# Patient Record
Sex: Male | Born: 1960 | Hispanic: Yes | Marital: Married | Smoking: Never smoker
Health system: Southern US, Community
[De-identification: ages and names within clinical notes are randomized; demographics above are authoritative.]

## PROBLEM LIST (undated history)

## (undated) HISTORY — PX: HERNIA REPAIR: SHX51

---

## 2006-10-05 ENCOUNTER — Ambulatory Visit: Payer: Self-pay | Admitting: Family Medicine

## 2007-08-24 ENCOUNTER — Ambulatory Visit: Payer: Self-pay | Admitting: Family Medicine

## 2008-05-12 IMAGING — CR DG RIBS 2V*L*
1 series · 3 of 3 positions shown · non-contrast
Comparison: none

REASON FOR EXAM: xray lt rib injury call report office [phone] fx
[phone]
COMMENTS:

RESULT:     Three views of the LEFT ribs show a minimally displaced fracture
of the 10th LEFT rib posterolaterally. No other rib fractures are seen. No
pneumothorax is observed.

[Series 1: view not recorded · 0.17mm/px · 3 of 3 slices shown]
[im 1/3]
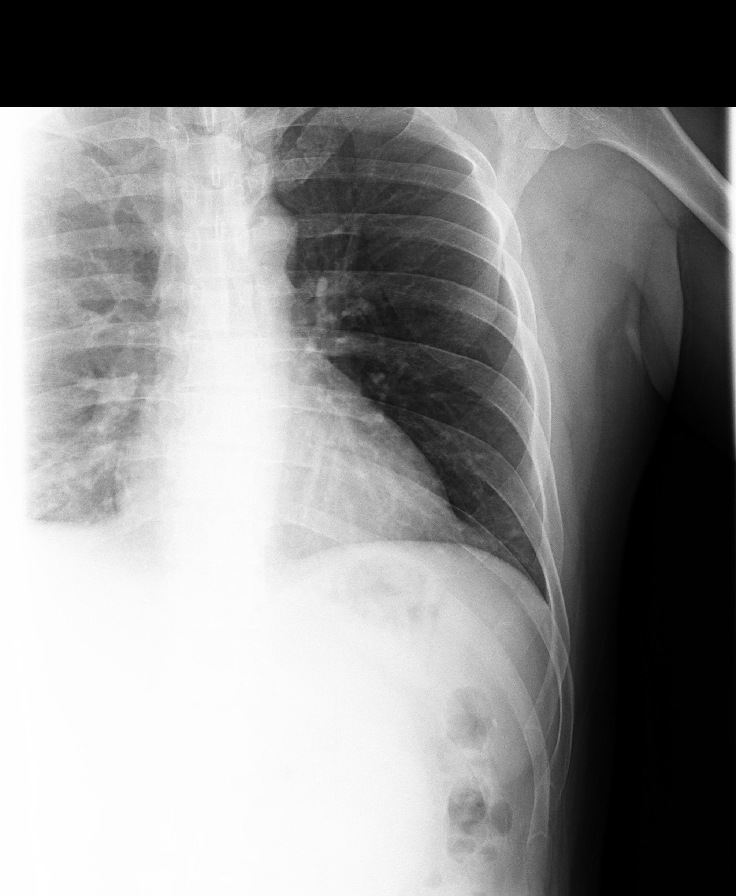
[im 2/3]
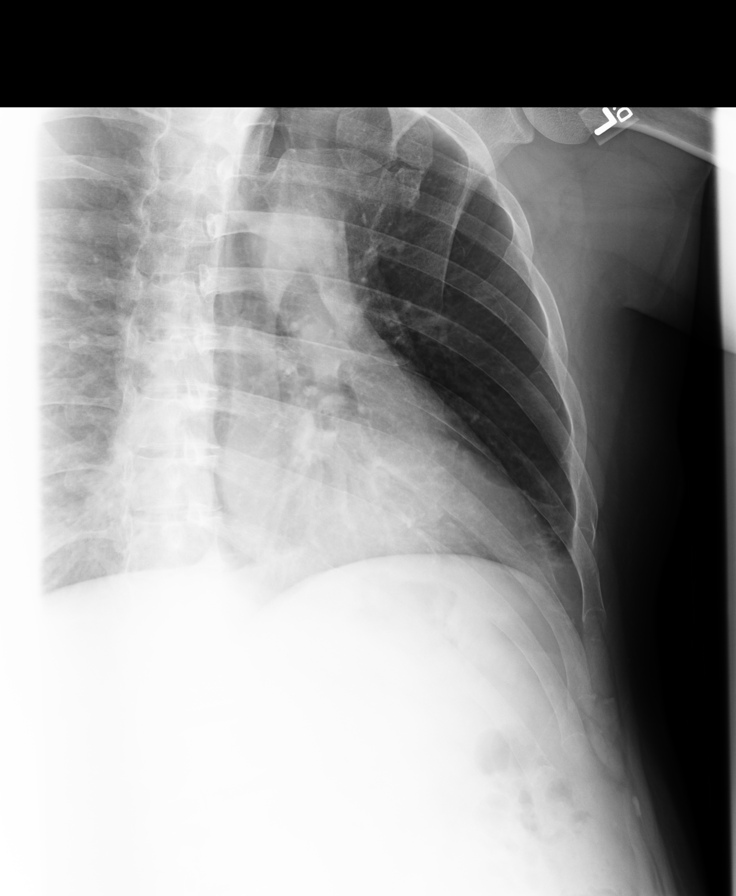
[im 3/3]
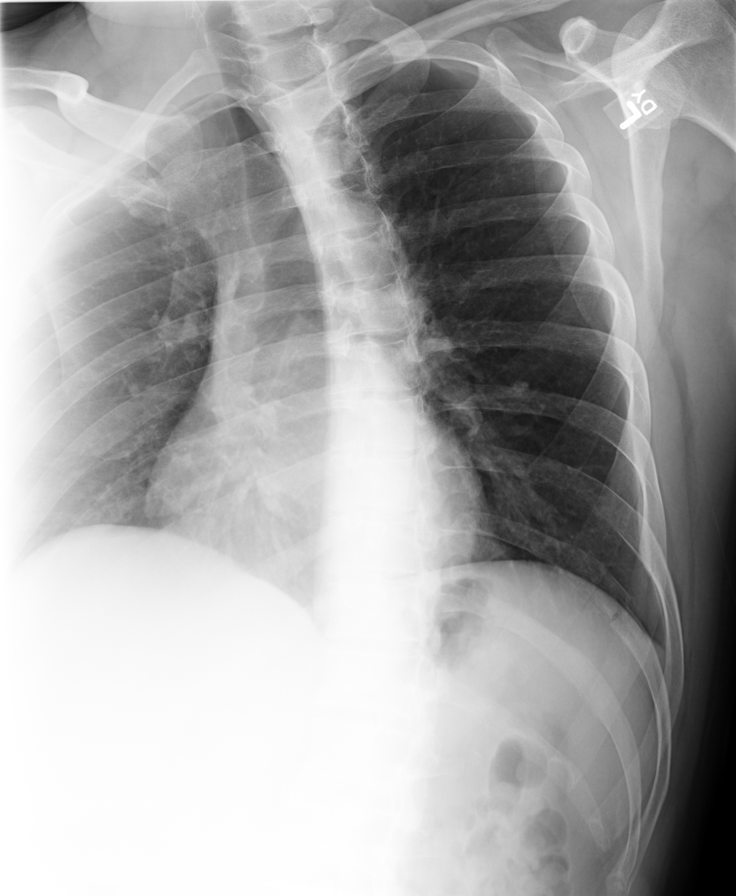

[3 of 3 positions shown; findings below may reference images not displayed]

IMPRESSION: 1.     Fracture of the 10th LEFT rib, minimally displaced.

## 2009-03-31 IMAGING — CR DG CHEST 2V
1 series · 2 of 2 positions shown · non-contrast
Comparison: none

REASON FOR EXAM: pain after lifting heavy lumber
COMMENTS:

PROCEDURE:     MDR - MDR CHEST PA(OR AP) AND LATERAL  - August 24, 2007 [DATE]
RESULT:     The lung fields are clear. The heart, mediastinal and osseous
structures show no significant abnormalities.

[Series 1: view not recorded · 0.17mm/px · 2 of 2 slices shown]
[im 1/2]
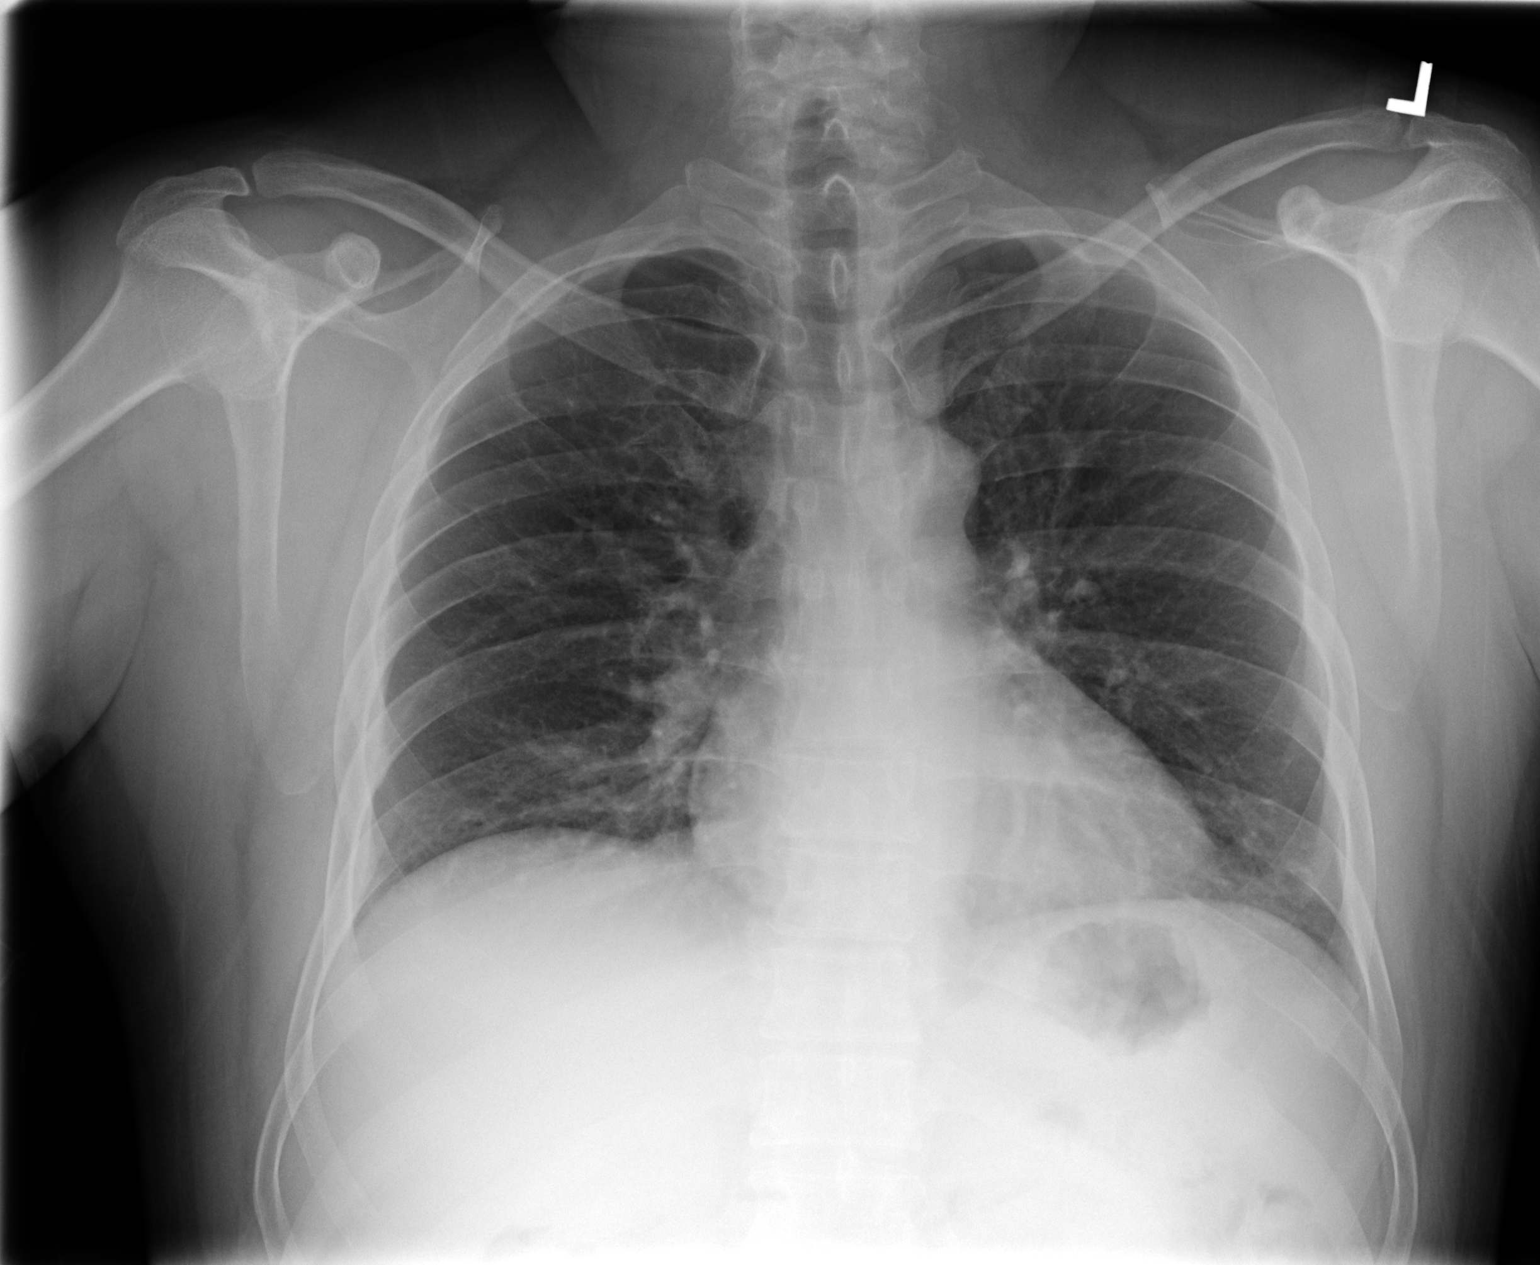
[im 2/2]
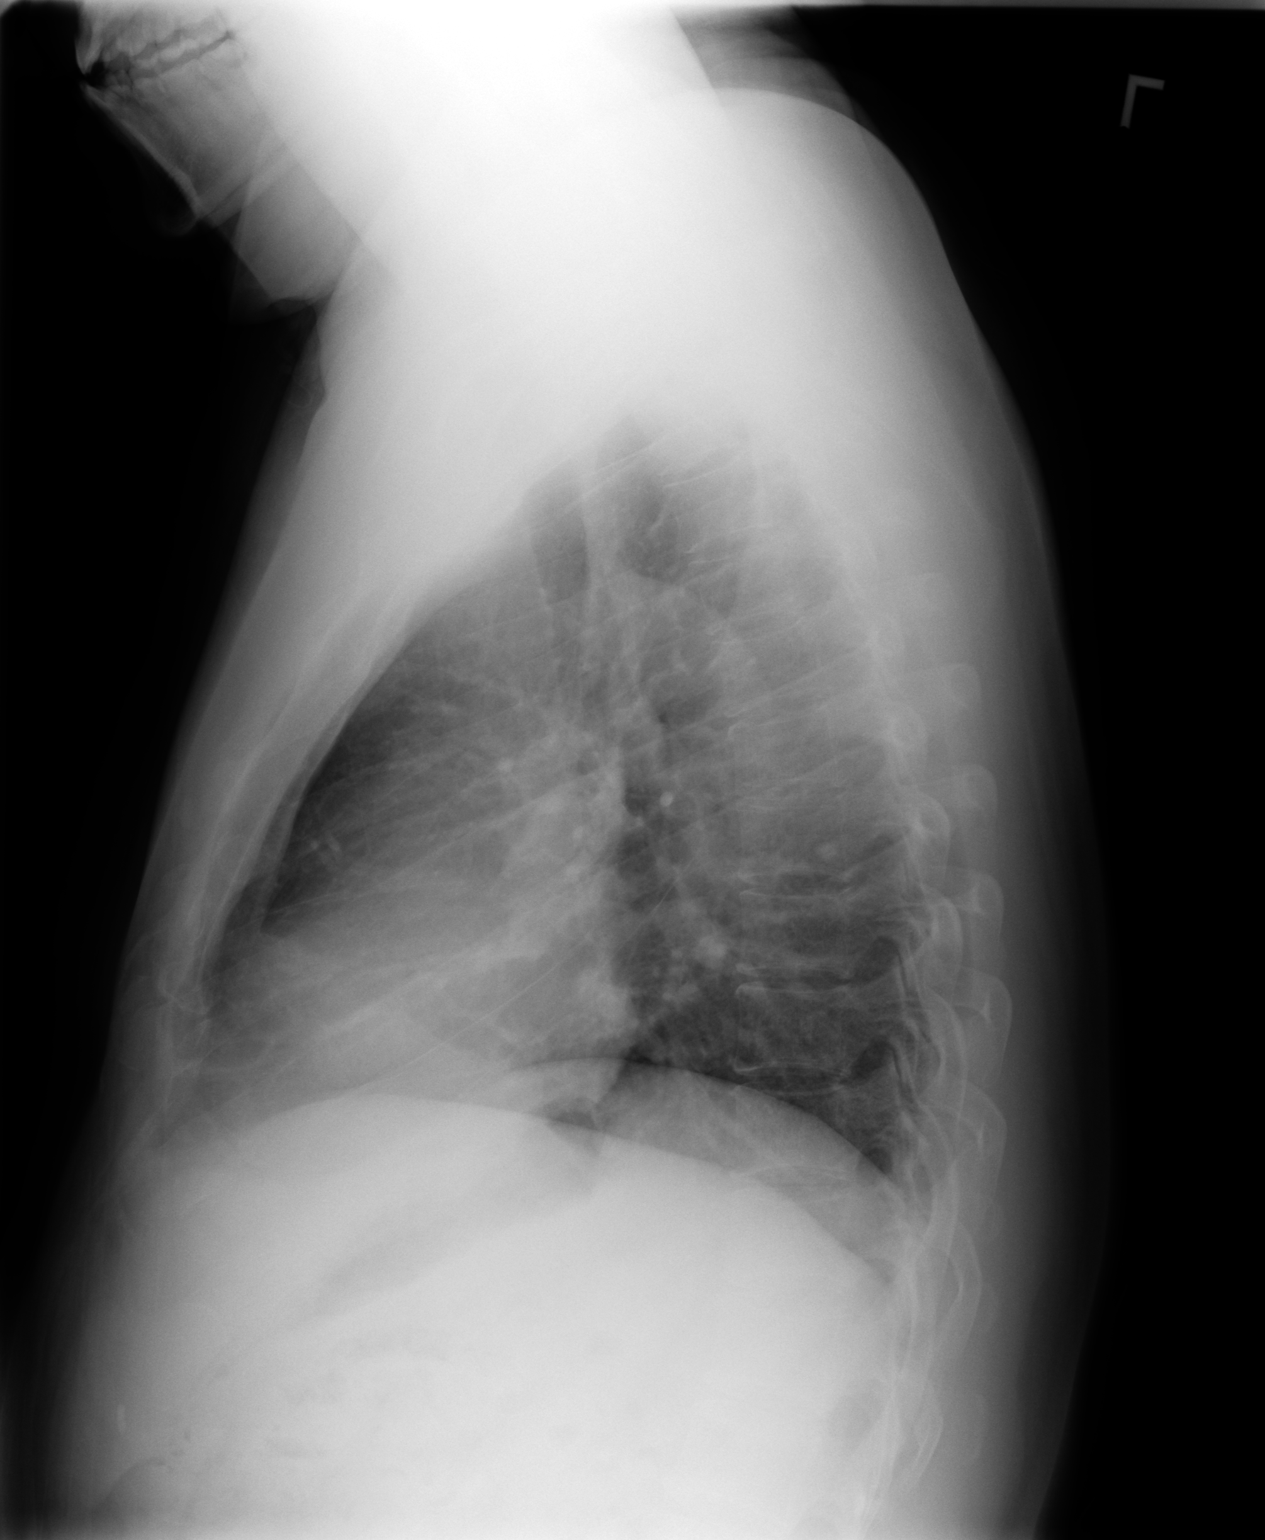

[2 of 2 positions shown; findings below may reference images not displayed]

IMPRESSION: 1.     No acute changes are identified.

## 2019-10-05 ENCOUNTER — Emergency Department
Admission: EM | Admit: 2019-10-05 | Discharge: 2019-10-05 | Disposition: A | Discharge status: Home / Self Care | Payer: BC Managed Care – PPO | Attending: Emergency Medicine | Admitting: Emergency Medicine

## 2019-10-05 ENCOUNTER — Encounter: Payer: Self-pay | Admitting: Emergency Medicine

## 2019-10-05 ENCOUNTER — Other Ambulatory Visit: Payer: Self-pay

## 2019-10-05 ENCOUNTER — Emergency Department: Payer: BC Managed Care – PPO

## 2019-10-05 DIAGNOSIS — R0602 Shortness of breath: Secondary | ICD-10-CM | POA: Diagnosis present

## 2019-10-05 DIAGNOSIS — R109 Unspecified abdominal pain: Secondary | ICD-10-CM | POA: Insufficient documentation

## 2019-10-05 LAB — CBC WITH DIFFERENTIAL/PLATELET
Abs Immature Granulocytes: 0.05 10*3/uL (ref 0.00–0.07)
Basophils Absolute: 0 10*3/uL (ref 0.0–0.1)
Basophils Relative: 0 %
Eosinophils Absolute: 0.3 10*3/uL (ref 0.0–0.5)
Eosinophils Relative: 6 %
HCT: 41.5 % (ref 39.0–52.0)
Hemoglobin: 14.9 g/dL (ref 13.0–17.0)
Immature Granulocytes: 1 %
Lymphocytes Relative: 29 %
Lymphs Abs: 1.4 10*3/uL (ref 0.7–4.0)
MCH: 31 pg (ref 26.0–34.0)
MCHC: 35.9 g/dL (ref 30.0–36.0)
MCV: 86.5 fL (ref 80.0–100.0)
Monocytes Absolute: 0.5 10*3/uL (ref 0.1–1.0)
Monocytes Relative: 10 %
Neutro Abs: 2.7 10*3/uL (ref 1.7–7.7)
Neutrophils Relative %: 54 %
Platelets: 149 10*3/uL — ABNORMAL LOW (ref 150–400)
RBC: 4.8 MIL/uL (ref 4.22–5.81)
RDW: 12.9 % (ref 11.5–15.5)
WBC: 4.9 10*3/uL (ref 4.0–10.5)
nRBC: 0 % (ref 0.0–0.2)

## 2019-10-05 LAB — COMPREHENSIVE METABOLIC PANEL
ALT: 24 U/L (ref 0–44)
AST: 22 U/L (ref 15–41)
Albumin: 3.9 g/dL (ref 3.5–5.0)
Alkaline Phosphatase: 57 U/L (ref 38–126)
Anion gap: 8 (ref 5–15)
BUN: 14 mg/dL (ref 6–20)
CO2: 28 mmol/L (ref 22–32)
Calcium: 8.4 mg/dL — ABNORMAL LOW (ref 8.9–10.3)
Chloride: 104 mmol/L (ref 98–111)
Creatinine, Ser: 0.61 mg/dL (ref 0.61–1.24)
GFR calc Af Amer: >60 mL/min (ref >60)
GFR calc non Af Amer: >60 mL/min (ref >60)
Glucose, Bld: 110 mg/dL — ABNORMAL HIGH (ref 70–99)
Potassium: 3.3 mmol/L — ABNORMAL LOW (ref 3.5–5.1)
Sodium: 140 mmol/L (ref 135–145)
Total Bilirubin: 1 mg/dL (ref 0.3–1.2)
Total Protein: 6.8 g/dL (ref 6.5–8.1)

## 2019-10-05 LAB — TROPONIN I (HIGH SENSITIVITY)
Troponin I (High Sensitivity): 7 ng/L (ref <18)
Troponin I (High Sensitivity): 7 ng/L (ref <18)

## 2019-10-05 LAB — LIPASE, BLOOD: Lipase: 32 U/L (ref 11–51)

## 2019-10-05 MED ORDER — FAMOTIDINE 20 MG PO TABS
20.0000 mg | ORAL_TABLET | Freq: Two times a day (BID) | ORAL | 0 refills | Status: AC
Start: 2019-10-05 — End: 2019-11-04

## 2019-10-05 MED ORDER — ALUM & MAG HYDROXIDE-SIMETH 200-200-20 MG/5ML PO SUSP
30.0000 mL | Freq: Once | ORAL | Status: AC
  Administered 2019-10-05: 15:00:00 30 mL via ORAL
  Filled 2019-10-05: qty 30

## 2019-10-05 NOTE — ED Triage Notes (Signed)
C/O SOB, stomach is swelling.  Patient thought symptoms were r/t acid reflux. Took Alka Seltzer, no relief.  Also c/o lower back pain.  All symptoms started this morning.  Patient is AAOx3.  Skin warm and dry. No SOB? DOE.  NAD

## 2019-10-05 NOTE — ED Provider Notes (Signed)
Troponin unchanged from previous check.  Patient ambulatory no distress.  Appropriate for discharge, following plan as outlined by Dr. Colon Branch and Dr. Sinclair Ship, MD 10/05/19 1718

## 2019-10-05 NOTE — Discharge Instructions (Signed)
Thank you for letting us take care of you in the emergency department today.   Please continue to take any regular, prescribed medications.   New medications we have prescribed:  Famotidine, for stomach inflammation  Please follow up with: Your primary care doctor to review your ER visit and follow up on your symptoms. If you do not have one, information for the [facility] is below.  Please return to the ER for any new or worsening symptoms.

## 2019-10-05 NOTE — ED Provider Notes (Signed)
[facility] Emergency Department Provider Note  ____________________________________________   First MD Initiated Contact with Patient 10/05/19 1257     (approximate)  I have reviewed the triage vital signs and the nursing notes.  History  Chief Complaint Shortness of Breath    HPI John Osborne is a 59 y.o. male no significant medical history who presents to the emergency department for shortness of breath and upper abdominal discomfort.  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  He states the symptoms made him feel short of breath.  He denies any discrete pain.  He went home from shift and took a nap, but when he woke up he felt like his symptoms were still there and therefore presented to the ER for further evaluation.  He denies any discrete abdominal pain or chest pain.  States symptoms feel more like inflammation of the upper abdominal area, mild in severity, no radiation.  No apparent alleviating or aggravating components.  Denies any heavy alcohol use.  No changes with eating.  No fevers or cough or sick contacts.   Past Medical Hx History reviewed. No pertinent past medical history.  Problem List There are no problems to display for this patient.   Past Surgical Hx Past Surgical History:  Procedure Laterality Date  . HERNIA REPAIR      Medications Prior to Admission medications   Not on File    Allergies Patient has no known allergies.  Family Hx No family history on file.  Social Hx Social History   Tobacco Use  . Smoking status: Never Smoker  . Smokeless tobacco: Never Used  Substance Use Topics  . Alcohol use: Not Currently  . Drug use: Not Currently     Review of Systems  Constitutional: Negative for fever.  Negative for chills. Eyes: Negative for visual changes. ENT: Negative for sore throat. Cardiovascular: Negative for chest pain. Respiratory: + for shortness of breath. Gastrointestinal: Negative for nausea. Negative for vomiting. + "inflammation" Genitourinary: Negative for dysuria. Musculoskeletal: Negative for leg swelling. Skin: Negative for rash. Neurological: Negative for headaches.   Physical Exam  Vital Signs: ED Triage Vitals  Enc Vitals Group     BP 10/05/19 1101 (!) 171/91     Pulse Rate 10/05/19 1101 69     Resp 10/05/19 1101 16     Temp 10/05/19 1101 98 F (36.7 C)     Temp Source 10/05/19 1101 Oral     SpO2 10/05/19 1101 97 %     Weight 10/05/19 1058 155 lb (70.3 kg)     Height 10/05/19 1058 5\' 3"  (1.6 m)     Head Circumference --      Peak Flow --      Pain Score 10/05/19 1057 0     Pain Loc --      Pain Edu? --      Excl. in [place]? --     Constitutional: Alert and oriented. Well appearing. NAD.  Head: Normocephalic. Atraumatic. Eyes: Conjunctivae clear. Sclera anicteric. Pupils equal and symmetric. Nose: No masses or lesions. No congestion or rhinorrhea. Mouth/Throat: Wearing mask.  Neck: No stridor. Trachea midline.  Cardiovascular: Normal rate, regular rhythm. Extremities well perfused. Respiratory: Normal respiratory effort.  Lungs CTAB. Gastrointestinal: Soft.  No distention. Non-tender throughout to deep  palpation.  Bowel sounds present. Genitourinary: Deferred. Musculoskeletal: No lower extremity edema. No deformities. Neurologic:  Normal speech and language. No gross focal or lateralizing neurologic deficits are appreciated.  Skin: Skin is warm, dry and intact. No rash noted. Psychiatric: Mood and affect are appropriate for situation.  EKG  Personally reviewed and interpreted by myself.   Date: 10/05/19 Time: 1100 Rate: 70 Rhythm: sinus, w/ sinus arrhythmia  Axis: normal Intervals: WNL Nonspecific changes aVL, no reciprocal changes No  STEMI    Radiology  Personally reviewed available imaging myself.   CXR - IMPRESSION: No evidence of acute cardiopulmonary abnormality.   Procedures  Procedure(s) performed (including critical care):  Procedures   Initial Impression / Assessment and Plan / MDM / ED Course  59 y.o. male who presents to the ED for feeling "inflammation" in his upper abdominal area with resultant feeling of SOB.  Ddx: atypical ACS, gastritis/esophagitis, GERD, pancreatitis, lower lobe pneumonia.  No tachycardia, tachypnea, hypoxia, leg swelling, doubt PE.  Will plan for labs, EKG, imaging  EKG without evidence of acute arrhythmia or ischemia.  Initial troponin negative.  Labs thus far without actionable derangements - normal bilirubin, normal LFTs, normal lipase.  CXR negative.  Will treat with GI cocktail, and obtain delta troponin.  If negative, anticipate discharge with famotidine and outpatient follow-up.   _______________________________   As part of my medical decision making I have reviewed available labs, radiology tests, reviewed old records/performed chart review.   Final Clinical Impression(s) / ED Diagnosis  Final diagnoses:  SOB (shortness of breath)  Abdominal discomfort       Note:  This document was prepared using Dragon voice recognition software and may include unintentional dictation errors.   Miguel Aschoff., MD 10/05/19 [phone]

## 2020-10-24 ENCOUNTER — Other Ambulatory Visit: Payer: Self-pay

## 2020-10-24 ENCOUNTER — Encounter: Payer: Self-pay | Admitting: Emergency Medicine

## 2020-10-24 ENCOUNTER — Emergency Department
Admission: EM | Admit: 2020-10-24 | Discharge: 2020-10-24 | Disposition: A | Discharge status: Home / Self Care | Payer: BC Managed Care – PPO | Attending: Emergency Medicine | Admitting: Emergency Medicine

## 2020-10-24 DIAGNOSIS — M791 Myalgia, unspecified site: Secondary | ICD-10-CM | POA: Insufficient documentation

## 2020-10-24 DIAGNOSIS — E860 Dehydration: Secondary | ICD-10-CM

## 2020-10-24 LAB — CBC WITH DIFFERENTIAL/PLATELET
Abs Immature Granulocytes: 0.08 10*3/uL — ABNORMAL HIGH (ref 0.00–0.07)
Basophils Absolute: 0 10*3/uL (ref 0.0–0.1)
Basophils Relative: 0 %
Eosinophils Absolute: 0 10*3/uL (ref 0.0–0.5)
Eosinophils Relative: 1 %
HCT: 48.7 % (ref 39.0–52.0)
Hemoglobin: 17.3 g/dL — ABNORMAL HIGH (ref 13.0–17.0)
Immature Granulocytes: 1 %
Lymphocytes Relative: 15 %
Lymphs Abs: 1.2 10*3/uL (ref 0.7–4.0)
MCH: 30.9 pg (ref 26.0–34.0)
MCHC: 35.5 g/dL (ref 30.0–36.0)
MCV: 87 fL (ref 80.0–100.0)
Monocytes Absolute: 0.8 10*3/uL (ref 0.1–1.0)
Monocytes Relative: 10 %
Neutro Abs: 5.9 10*3/uL (ref 1.7–7.7)
Neutrophils Relative %: 73 %
Platelets: 214 10*3/uL (ref 150–400)
RBC: 5.6 MIL/uL (ref 4.22–5.81)
RDW: 13.4 % (ref 11.5–15.5)
WBC: 8 10*3/uL (ref 4.0–10.5)
nRBC: 0 % (ref 0.0–0.2)

## 2020-10-24 LAB — BASIC METABOLIC PANEL
Anion gap: 16 — ABNORMAL HIGH (ref 5–15)
BUN: 27 mg/dL — ABNORMAL HIGH (ref 6–20)
CO2: 22 mmol/L (ref 22–32)
Calcium: 9.5 mg/dL (ref 8.9–10.3)
Chloride: 103 mmol/L (ref 98–111)
Creatinine, Ser: 1.88 mg/dL — ABNORMAL HIGH (ref 0.61–1.24)
GFR, Estimated: 40 mL/min — ABNORMAL LOW (ref >60)
Glucose, Bld: 117 mg/dL — ABNORMAL HIGH (ref 70–99)
Potassium: 4.3 mmol/L (ref 3.5–5.1)
Sodium: 141 mmol/L (ref 135–145)

## 2020-10-24 MED ORDER — SODIUM CHLORIDE 0.9 % IV BOLUS
1000.0000 mL | Freq: Once | INTRAVENOUS | Status: AC
  Administered 2020-10-24: 1000 mL via INTRAVENOUS

## 2020-10-24 NOTE — ED Notes (Addendum)
Interpreter used for discharge instructions provided to patient. IVF complete prior to discharge. Patient and wife both verbalized understanding of discharge instructions via interpreter. Patient ambulatory to POV with wife. Gait steady.

## 2020-10-24 NOTE — ED Provider Notes (Signed)
[facility] Emergency Department Provider Note ____________________________________________   Event Date/Time   First MD Initiated Contact with Patient 10/24/20 2009     (approximate)  I have reviewed the triage vital signs and the nursing notes.   HISTORY  Chief Complaint Generalized Body Aches  HPI John Osborne is a 60 y.o. male with no chronic medical history presents to the emergency department for treatment and evaluation of body cramping that started earlier today. He works outside in the heat all day. He drinks water but isn't sure it's enough. He took 3 green capsules, but isn't sure what they were. He denies nausea, vomiting, abdominal pain, or other concerns.     History reviewed. No pertinent past medical history.  There are no problems to display for this patient.   Past Surgical History:  Procedure Laterality Date  . HERNIA REPAIR      Prior to Admission medications   Medication Sig Start Date End Date Taking? Authorizing Provider  famotidine (PEPCID) 20 MG tablet Take 1 tablet (20 mg total) by mouth 2 (two) times daily. 10/05/19 11/04/19  Miguel Aschoff., MD    Allergies Patient has no known allergies.  History reviewed. No pertinent family history.  Social History Social History   Tobacco Use  . Smoking status: Never Smoker  . Smokeless tobacco: Never Used  Substance Use Topics  . Alcohol use: Not Currently  . Drug use: Not Currently    Review of Systems  Constitutional: No fever/chills Eyes: No visual changes. ENT: No sore throat. Cardiovascular: Denies chest pain. Respiratory: Denies shortness of breath. Gastrointestinal: No abdominal pain.  No nausea, no vomiting.  No diarrhea.  No constipation. Genitourinary: Negative for dysuria. Musculoskeletal: Positive for muscle cramping. Skin: Negative for rash. Neurological: Negative for headaches, focal weakness or  numbness. ____________________________________________   PHYSICAL EXAM:  VITAL SIGNS: ED Triage Vitals  Enc Vitals Group     BP 10/24/20 1811 (!) 168/114     Pulse Rate 10/24/20 1811 (!) 102     Resp 10/24/20 1811 18     Temp 10/24/20 1811 98.1 F (36.7 C)     Temp Source 10/24/20 1811 Oral     SpO2 10/24/20 1811 96 %     Weight 10/24/20 1812 150 lb (68 kg)     Height 10/24/20 1812 5' (1.524 m)     Head Circumference --      Peak Flow --      Pain Score 10/24/20 1812 10     Pain Loc --      Pain Edu? --      Excl. in [place]? --     Constitutional: Alert and oriented. Well appearing and in no acute distress. Eyes: Conjunctivae are normal. PERRL. EOMI. Head: Atraumatic. Nose: No congestion/rhinnorhea. Mouth/Throat: Mucous membranes are moist.  Oropharynx non-erythematous. Neck: No stridor.   Hematological/Lymphatic/Immunilogical: No cervical lymphadenopathy. Cardiovascular: Normal rate, regular rhythm. Grossly normal heart sounds.  Good peripheral circulation. Respiratory: Normal respiratory effort.  No retractions. Lungs CTAB. Gastrointestinal: Soft and nontender. No distention. No abdominal bruits. Genitourinary:  Musculoskeletal: No lower extremity tenderness nor edema.  No joint effusions. Neurologic:  Normal speech and language. No gross focal neurologic deficits are appreciated. No gait instability. Skin:  Skin is warm, dry and intact. No rash noted. Psychiatric: Mood and affect are normal. Speech and behavior are normal.  ____________________________________________   LABS (all labs ordered are listed, but only abnormal results are displayed)  Labs Reviewed -  No data to display ____________________________________________  EKG  Not indicated. ____________________________________________  RADIOLOGY  ED MD interpretation:    Not indicated.  I, Kem Boroughs, personally viewed and evaluated these images (plain radiographs) as part of my medical decision  making, as well as reviewing the written report by the radiologist.  Official radiology report(s): No results found.  ____________________________________________   PROCEDURES  Procedure(s) performed (including Critical Care):  Procedures  ____________________________________________   INITIAL IMPRESSION / ASSESSMENT AND PLAN     60 year old male presenting to the emergency department for treatment and evaluation of body cramping.  See HPI for further details.  Symptoms sound consistent with dehydration.  Will order some labs and give fluids.  ED COURSE  BUN and creatinine are slightly elevated at 27 and 1.8 with a GFR of 40.  There is no previous labs for comparison.  This does seem like a simple dehydration.  He did receive fluids here tonight with improvement of symptoms.  He will be discharged home with instructions on rehydration.  He was given a note for work for tomorrow and was encouraged to stay out of the sun.  He is to follow-up with primary care or return to the emergency department for symptoms of change or worsen.    ___________________________________________   FINAL CLINICAL IMPRESSION(S) / ED DIAGNOSES  Final diagnoses:  None     ED Discharge Orders    None       John Osborne was evaluated in Emergency Department on 10/24/2020 for the symptoms described in the history of present illness. He was evaluated in the context of the global COVID-19 pandemic, which necessitated consideration that the patient might be at risk for infection with the SARS-CoV-2 virus that causes COVID-19. Institutional protocols and algorithms that pertain to the evaluation of patients at risk for COVID-19 are in a state of rapid change based on information released by regulatory bodies including the CDC and federal and state organizations. These policies and algorithms were followed during the patient's care in the ED.   Note:  This document was prepared using Dragon  voice recognition software and may include unintentional dictation errors.   Chinita Pester, FNP 10/24/20 2253    Concha Se, MD 10/25/20 1435

## 2020-10-24 NOTE — ED Notes (Signed)
ED Provider at bedside. 

## 2020-10-24 NOTE — ED Triage Notes (Signed)
Pt comes into the ED via POV c/o generalized body aches that started today. Pt denies any known fevers.  Pt denies any cough congestion, etc.  Pt states his body aches are a cramping sensation at times.  Pt in NAD at this time with even and unlabored respirations.

## 2020-10-24 NOTE — ED Notes (Signed)
Interpreter used to provide discharge instructions.

## 2020-10-24 NOTE — ED Notes (Signed)
Wife and patient verbalized understanding of discharge instructions and follow up care. Patient provided a work note.

## 2020-10-24 NOTE — ED Notes (Signed)
Interpreter ([employer]) used by this RN to obtain H&P from patient. Patient reports "cramps everywhere," x 12 hours. Patient reports "it's all over, like muscle cramps." The patient denies chest pain or shortness of breath. 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 Patient reports drinking water, but reports sweating a lot today.

## 2020-10-24 NOTE — ED Notes (Signed)
Patient reports his boss gave him "3 green capsules to help with the pain", but patient cannot verify the name, or the dose. Patient reports taking those capsules around 4pm.

## 2021-05-12 IMAGING — CR DG CHEST 2V
1 series · 2 of 2 positions shown · non-contrast
Comparison: Chest radiograph 08/24/2007

CLINICAL DATA: Shortness of breath. Additional provided: Patient
reports shortness of breath, stomach swelling, low back pain.

EXAM:
CHEST - 2 VIEW

[Series 1: dg chest 2 view · 0.14mm/px · 2 of 2 slices shown]
[im 1/2]
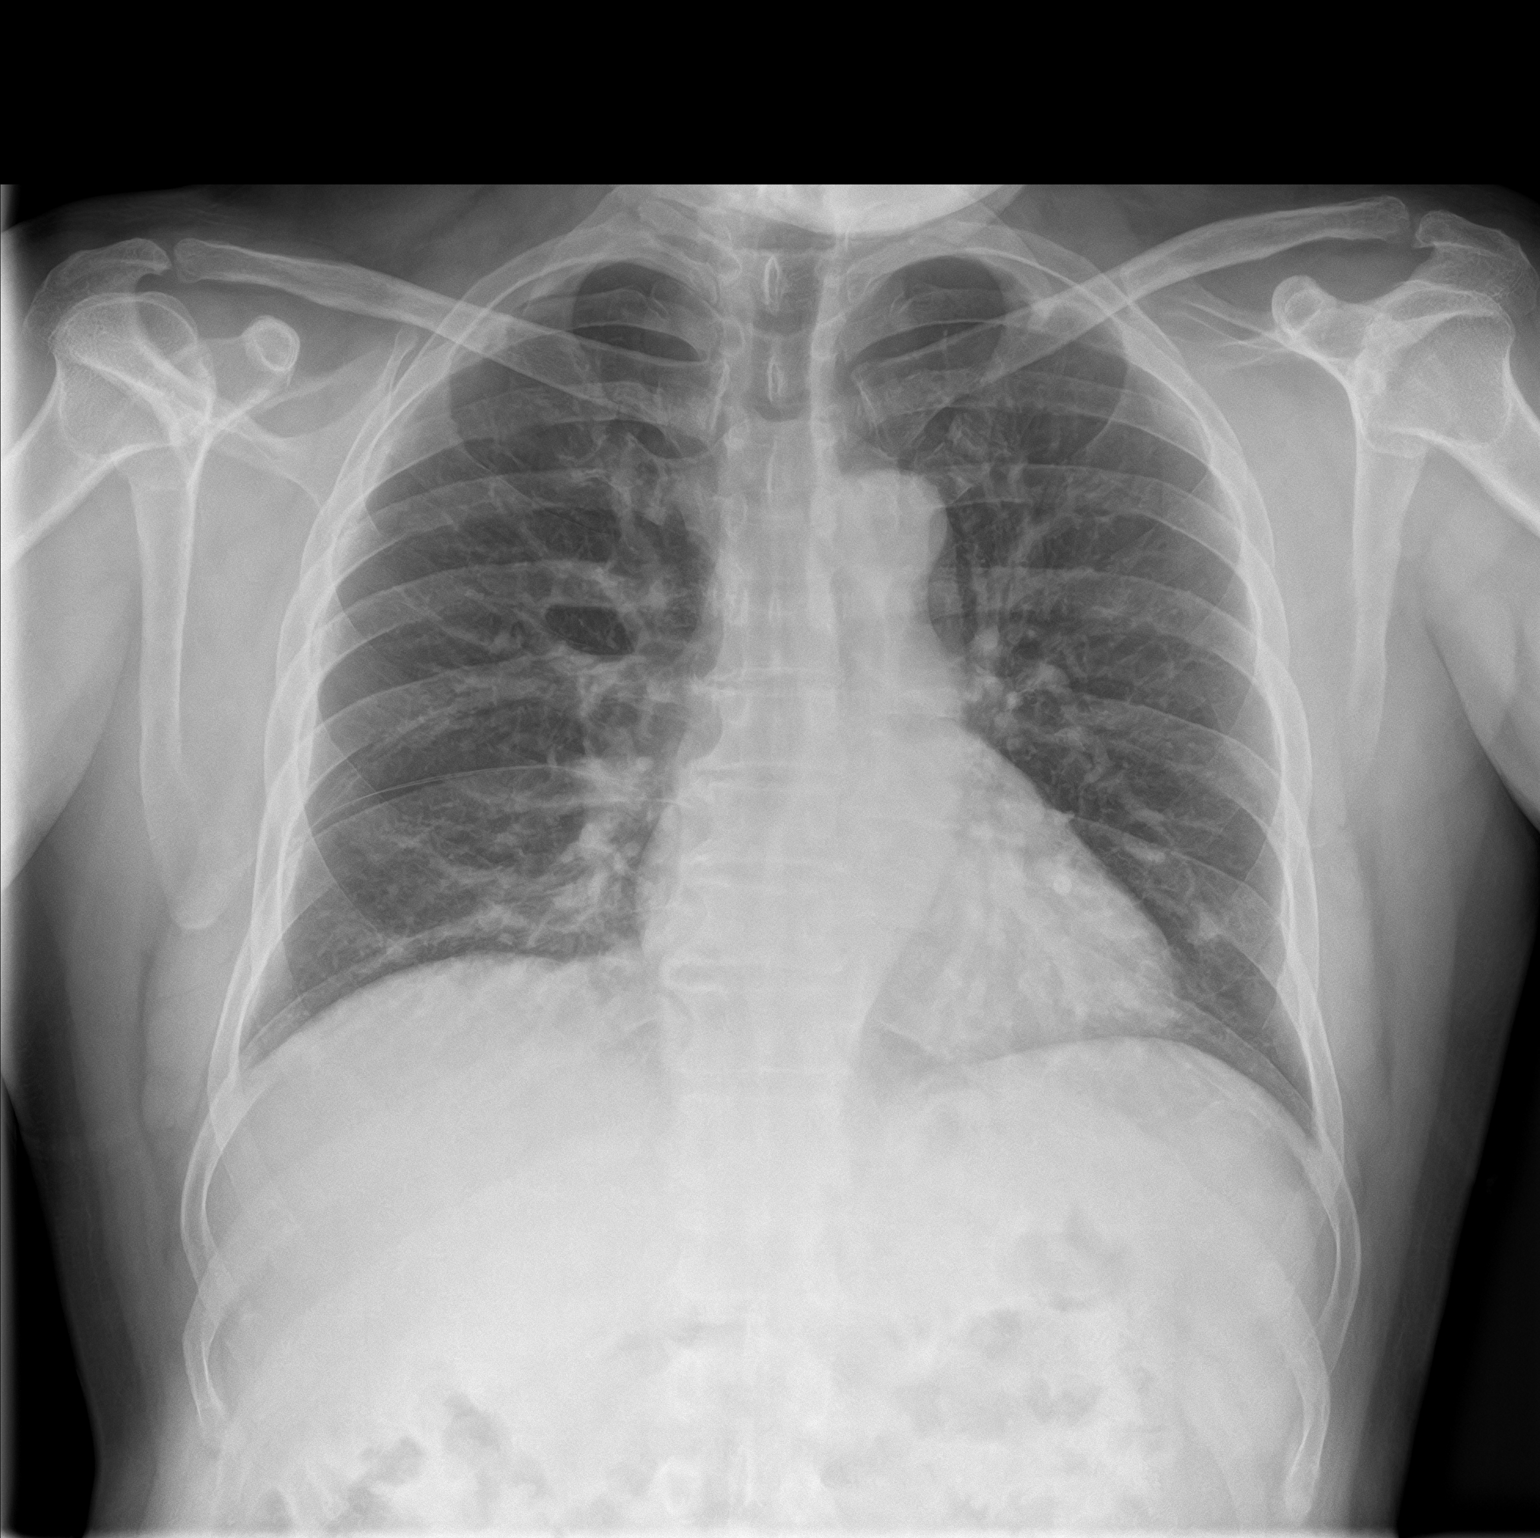
[im 2/2]
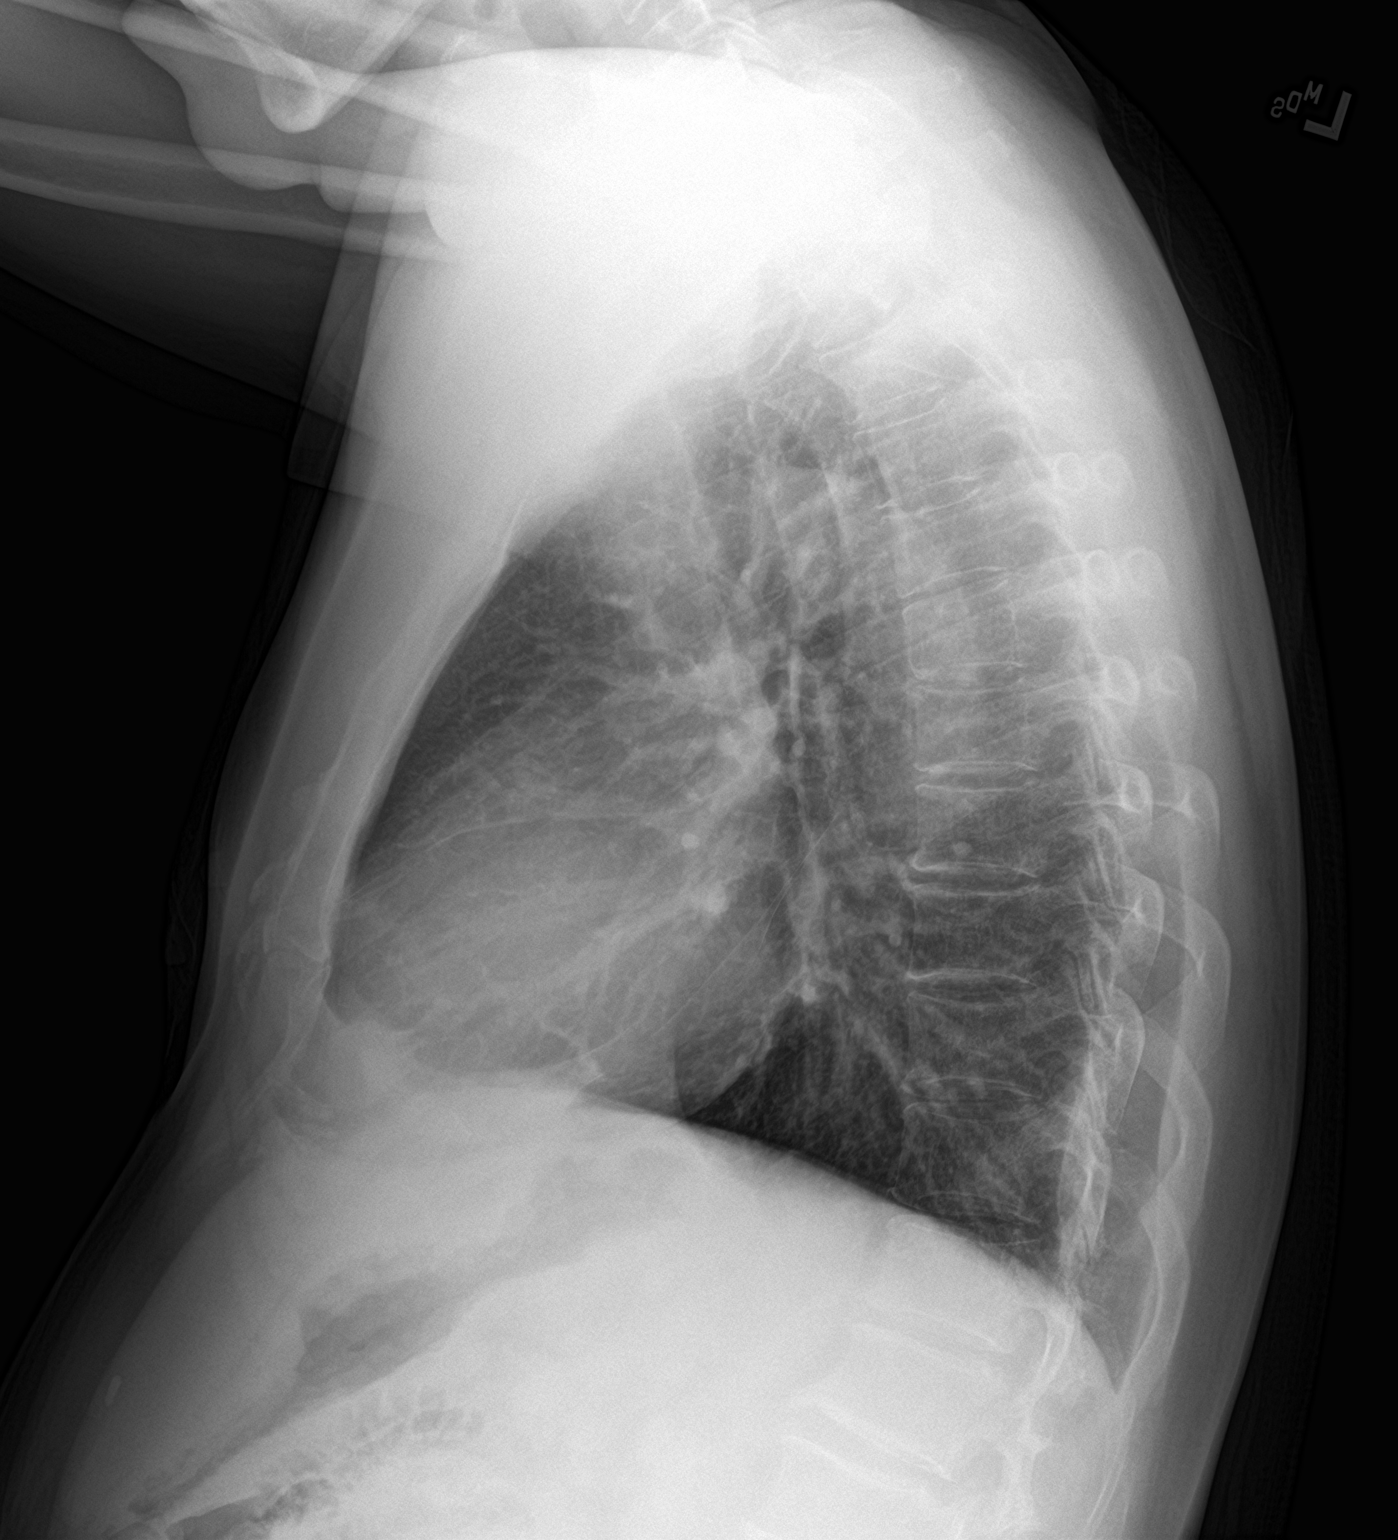

[2 of 2 positions shown; findings below may reference images not displayed]

FINDINGS: Heart size within normal limits.

There is no appreciable airspace consolidation.

No evidence of pleural effusion or pneumothorax.

No acute bony abnormality identified.
IMPRESSION: No evidence of acute cardiopulmonary abnormality.

## 2023-07-14 ENCOUNTER — Emergency Department: Payer: Self-pay

## 2023-07-14 ENCOUNTER — Other Ambulatory Visit: Payer: Self-pay

## 2023-07-14 ENCOUNTER — Emergency Department
Admission: EM | Admit: 2023-07-14 | Discharge: 2023-07-14 | Discharge status: Against Medical Advice | Payer: Self-pay | Attending: Emergency Medicine | Admitting: Emergency Medicine

## 2023-07-14 DIAGNOSIS — Z5321 Procedure and treatment not carried out due to patient leaving prior to being seen by health care provider: Secondary | ICD-10-CM | POA: Insufficient documentation

## 2023-07-14 DIAGNOSIS — R079 Chest pain, unspecified: Secondary | ICD-10-CM | POA: Insufficient documentation

## 2023-07-14 DIAGNOSIS — R0602 Shortness of breath: Secondary | ICD-10-CM | POA: Insufficient documentation

## 2023-07-14 LAB — CBC
HCT: 42.2 % (ref 39.0–52.0)
Hemoglobin: 15.1 g/dL (ref 13.0–17.0)
MCH: 30.7 pg (ref 26.0–34.0)
MCHC: 35.8 g/dL (ref 30.0–36.0)
MCV: 85.8 fL (ref 80.0–100.0)
Platelets: 201 10*3/uL (ref 150–400)
RBC: 4.92 MIL/uL (ref 4.22–5.81)
RDW: 13.1 % (ref 11.5–15.5)
WBC: 6 10*3/uL (ref 4.0–10.5)
nRBC: 0 % (ref 0.0–0.2)

## 2023-07-14 LAB — BASIC METABOLIC PANEL
Anion gap: 10 (ref 5–15)
BUN: 17 mg/dL (ref 8–23)
CO2: 24 mmol/L (ref 22–32)
Calcium: 9 mg/dL (ref 8.9–10.3)
Chloride: 106 mmol/L (ref 98–111)
Creatinine, Ser: 0.73 mg/dL (ref 0.61–1.24)
GFR, Estimated: >60 mL/min (ref >60)
Glucose, Bld: 137 mg/dL — ABNORMAL HIGH (ref 70–99)
Potassium: 3.3 mmol/L — ABNORMAL LOW (ref 3.5–5.1)
Sodium: 140 mmol/L (ref 135–145)

## 2023-07-14 LAB — RESP PANEL BY RT-PCR (RSV, FLU A&B, COVID)  RVPGX2
Influenza A by PCR: NEGATIVE
Influenza B by PCR: NEGATIVE
Resp Syncytial Virus by PCR: NEGATIVE
SARS Coronavirus 2 by RT PCR: NEGATIVE

## 2023-07-14 LAB — TROPONIN I (HIGH SENSITIVITY): Troponin I (High Sensitivity): 7 ng/L (ref <18)

## 2023-07-14 NOTE — ED Notes (Signed)
 20GA SL removed from rt a/c at pt request; cannula intact, dressing applied

## 2023-07-14 NOTE — ED Triage Notes (Signed)
 Pt to ED [facility] from home for chest pain (pressure) radiating to L arm since 1.5 hr ago. EMS gave 324mg  aspirin, 1 NTG spray, 1 inch NTG paste.   Pt states when CP was intense he also felt SOB. Pain was 10/10 but after meds pain is now 0 and pt does not have chest pain. Pt has intermittent dry cough, worse in mornings.   EMS VS: 181/107 then 166/95, HR 66, 99% RA, CBG 139.   Skin dry, denies pain at this time. Still has 1 inch NTG to L chest.   Spanish speaking, family at bedside that speaks both.

## 2023-07-14 NOTE — ED Provider Triage Note (Signed)
 Emergency Medicine Provider Triage Evaluation Note  Legion Discher , a 63 y.o. male  was evaluated in triage.  Pt complains of chest pain with radiation to the left arm.  Review of Systems  Positive:  Negative:   Physical Exam  BP (!) 178/102   Pulse 74   Temp 98.5 F (36.9 C) (Oral)   Resp 20   Ht 5' (1.524 m)   Wt 70.3 kg   SpO2 96%   BMI 30.27 kg/m  Gen:   Awake, no distress   Resp:  Normal effort  MSK:   Moves extremities without difficulty  Other:    Medical Decision Making  Medically screening exam initiated at 6:54 PM.  Appropriate orders placed.  Cristal Generous Serrato was informed that the remainder of the evaluation will be completed by another provider, this initial triage assessment does not replace that evaluation, and the importance of remaining in the ED until their evaluation is complete.  Patient arrived via EMS, medications given, no chest pain at this time, blood pressure elevated, ACS protocols   Faythe Ghee, PA-C 07/14/23 1854
